# Patient Record
Sex: Male | Born: 1985 | Hispanic: Yes | Marital: Single | Smoking: Never smoker
Health system: Southern US, Community
[De-identification: ages and names within clinical notes are randomized; demographics above are authoritative.]

---

## 2011-06-06 ENCOUNTER — Ambulatory Visit: Payer: Self-pay | Admitting: Unknown Physician Specialty

## 2015-11-28 ENCOUNTER — Emergency Department
Admission: EM | Admit: 2015-11-28 | Discharge: 2015-11-28 | Disposition: A | Discharge status: Home / Self Care | Payer: BLUE CROSS/BLUE SHIELD | Attending: Emergency Medicine | Admitting: Emergency Medicine

## 2015-11-28 ENCOUNTER — Emergency Department: Payer: BLUE CROSS/BLUE SHIELD

## 2015-11-28 DIAGNOSIS — N5089 Other specified disorders of the male genital organs: Secondary | ICD-10-CM

## 2015-11-28 DIAGNOSIS — N50811 Right testicular pain: Secondary | ICD-10-CM

## 2015-11-28 MED ORDER — OXYCODONE-ACETAMINOPHEN 5-325 MG PO TABS
2.0000 | ORAL_TABLET | Freq: Once | ORAL | Status: AC
  Administered 2015-11-28: 2 via ORAL
  Filled 2015-11-28: qty 2

## 2015-11-28 MED ORDER — IBUPROFEN 800 MG PO TABS: 800.0000 mg | ORAL_TABLET | Freq: Three times a day (TID) | ORAL | 0 refills | Status: AC | PRN

## 2015-11-28 MED ORDER — TRAMADOL HCL 50 MG PO TABS: 50.0000 mg | ORAL_TABLET | Freq: Four times a day (QID) | ORAL | 0 refills | Status: AC | PRN

## 2015-11-28 MED ORDER — DOXYCYCLINE HYCLATE 100 MG PO CAPS: 100.0000 mg | ORAL_CAPSULE | Freq: Two times a day (BID) | ORAL | 0 refills | Status: AC

## 2015-11-28 NOTE — ED Notes (Signed)
Pt states he has had consistent pain for the past week. Right testicle is primary concern.  States pain is similar, but worse than an episode he had approx a year ago. Denies GU symptoms.

## 2015-11-28 NOTE — ED Provider Notes (Signed)
[facility] Emergency Department Provider Note        Time seen: ----------------------------------------- 11:34 AM on 11/28/2015 -----------------------------------------    I have reviewed the triage vital signs and the nursing notes.   HISTORY  Chief Complaint Testicle Pain    HPI Zachary Jordan is a 30 y.o. male who presents the ER with right-sided testicular pain for the last week. Patient reports his been red and swollen during the same period time. He reports severe tenderness but has not had any trouble with normal activity, states she is sexually active but uses condoms, does not have any dysuria. Has never had these symptoms before.   History reviewed. No pertinent past medical history.  There are no active problems to display for this patient.   History reviewed. No pertinent surgical history.  Allergies Penicillins  Social History Social History  Substance Use Topics  . Smoking status: Never Smoker  . Smokeless tobacco: Never Used  . Alcohol use Yes     Comment: occassionally    Review of Systems Constitutional: Negative for fever. Gastrointestinal: Negative for abdominal pain, vomiting and diarrhea. Genitourinary: Negative for dysuria.Positive for right-sided testicular pain Skin: Negative for rash. Neurological: Negative for headaches, focal weakness or numbness.  10-point ROS otherwise negative.  ____________________________________________   PHYSICAL EXAM:  VITAL SIGNS: ED Triage Vitals [11/28/15 1051]  Enc Vitals Group     BP (!) 134/91     Pulse Rate 69     Resp 16     Temp 98.1 F (36.7 C)     Temp Source Oral     SpO2 100 %     Weight 170 lb (77.1 kg)     Height 5\' 6"  (1.676 m)     Head Circumference      Peak Flow      Pain Score 7     Pain Loc      Pain Edu?      Excl. in [place]?     Constitutional: Alert and oriented. Well appearing and in no distress. Eyes: Conjunctivae are normal.  Normal  extraocular movements. Gastrointestinal: Soft and nontender. Normal bowel sounds Genitourinary: No specific testicular swelling or tenderness, scrotum appears normal Musculoskeletal: Nontender with normal range of motion in all extremities. No lower extremity tenderness nor edema. Neurologic:  Normal speech and language. No gross focal neurologic deficits are appreciated.  Skin:  Skin is warm, dry and intact. No rash noted. Psychiatric: Mood and affect are normal. Speech and behavior are normal.  ___________________________________________  ED COURSE:  Pertinent labs & imaging results that were available during my care of the patient were reviewed by me and considered in my medical decision making (see chart for details). Clinical Course  Patient is no acute distress, we will assess with ultrasound imaging.  Procedures ____________________________________________   RADIOLOGY  Scrotal ultrasound IMPRESSION: Unremarkable scrotal ultrasound.  No evidence of torsion.   ____________________________________________  FINAL ASSESSMENT AND PLAN  Testicular pain  Plan: Patient with labs and imaging as dictated above. Unclear etiology for his testicular pain, he'll be referred to urology for follow-up. We'll place on doxycycline. He is stable for discharge this time.   Emily FilbertWilliams, Lafe Clerk E, MD   Note: This dictation was prepared with Dragon dictation. Any transcriptional errors that result from this process are unintentional    Emily FilbertJonathan E Terran Klinke, MD 11/28/15 [phone]

## 2015-11-28 NOTE — ED Triage Notes (Signed)
Patient presents to the ED with right testicular pain, swelling, and redness x 1 week.  Patient reports severe tenderness to area.

## 2017-07-15 IMAGING — US US ART/VEN ABD/PELV/SCROTUM DOPPLER COMPLETE
1 series · 14 of 25 positions shown · non-contrast
Comparison: None.

CLINICAL DATA: Right testicular pain and swelling.

EXAM:
SCROTAL ULTRASOUND
DOPPLER ULTRASOUND OF THE TESTICLES
TECHNIQUE: Complete ultrasound examination of the testicles, epididymis, and
other scrotal structures was performed. Color and spectral Doppler
ultrasound were also utilized to evaluate blood flow to the
testicles.

[Series 1: us art/ven abd/pelv/scrotum doppler complete · 0.08mm/px · 14 of 48 slices shown]
[im 1/48]
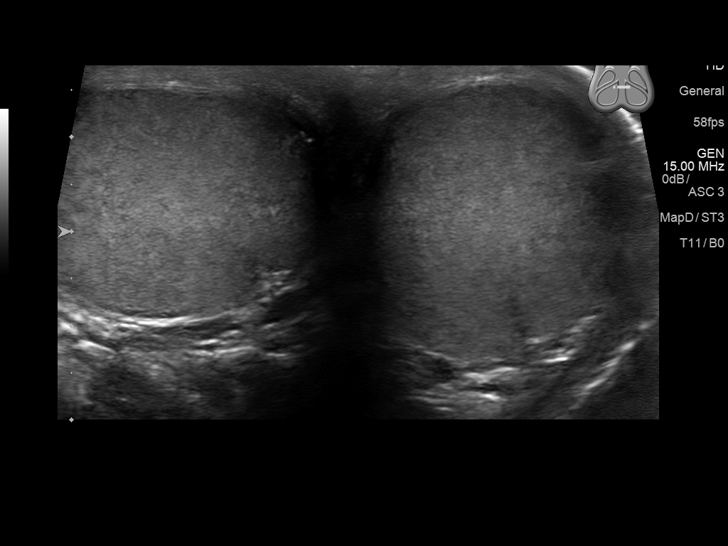
[im 4/48]
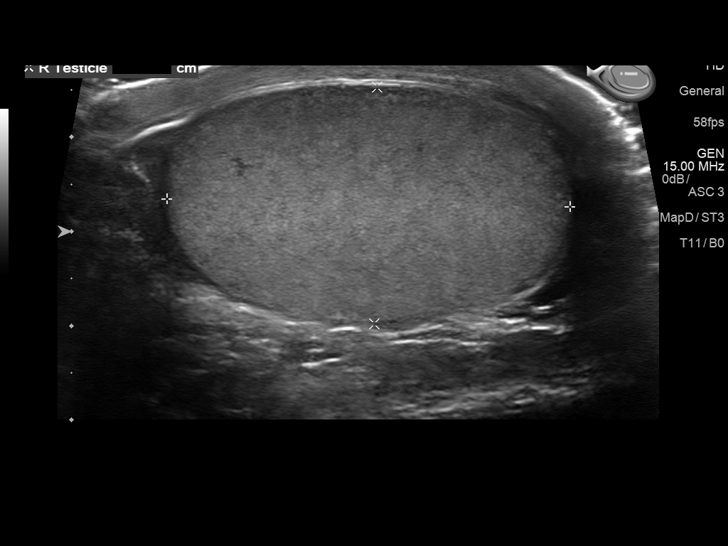
[im 8/48]
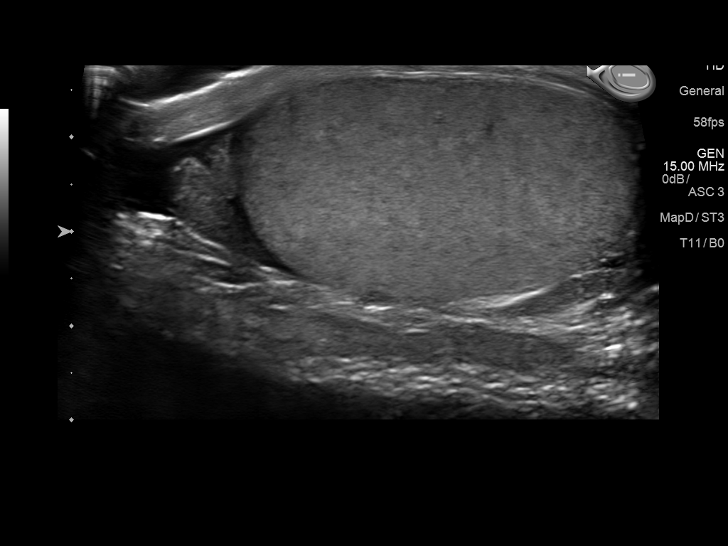
[im 12/48]
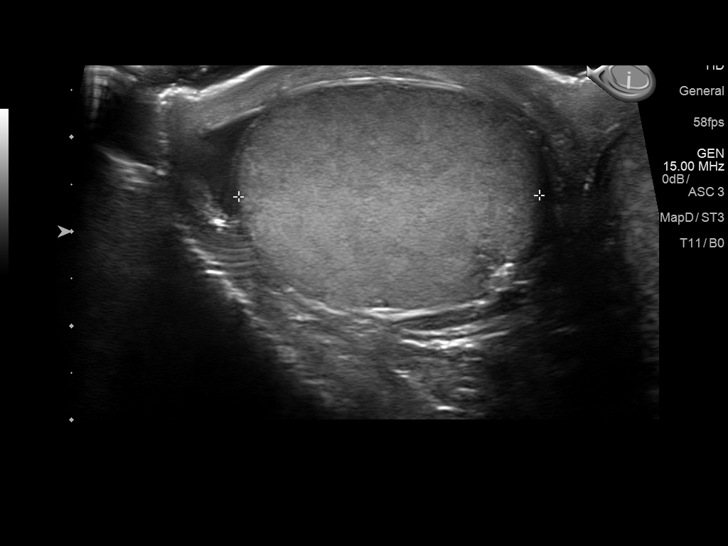
[im 16/48]
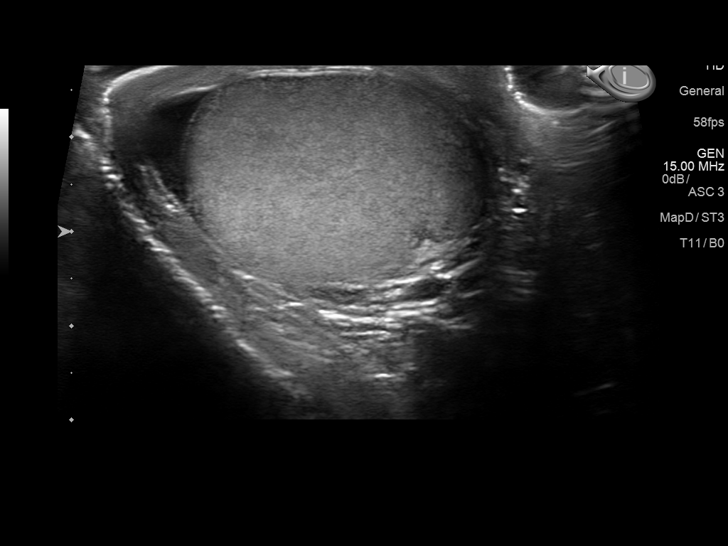
[im 18/48]
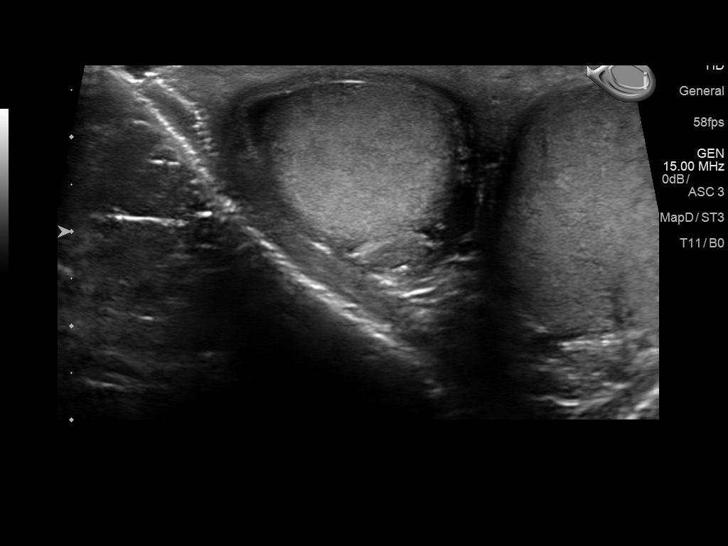
[im 22/48]
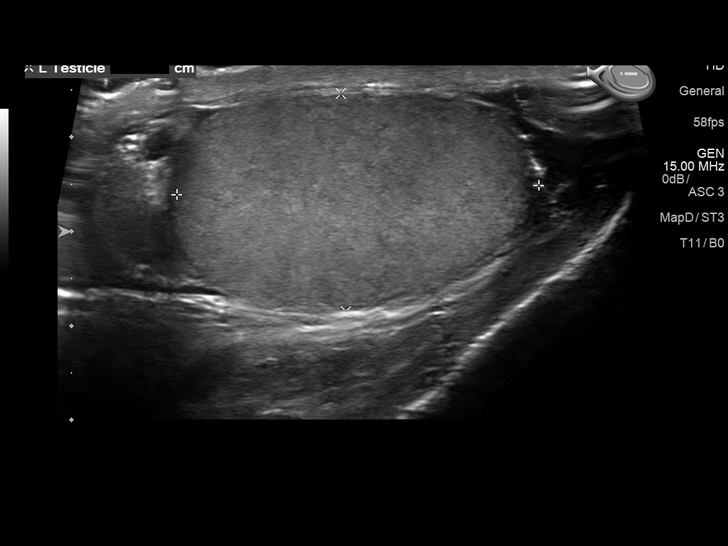
[im 26/48]
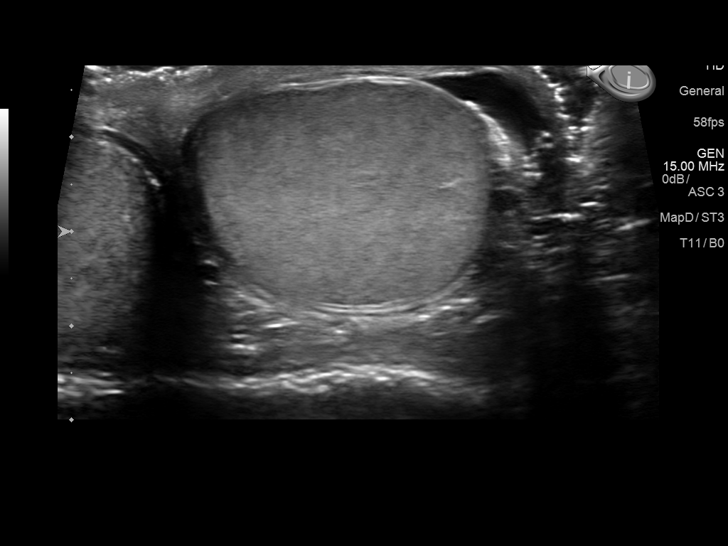
[im 30/48]
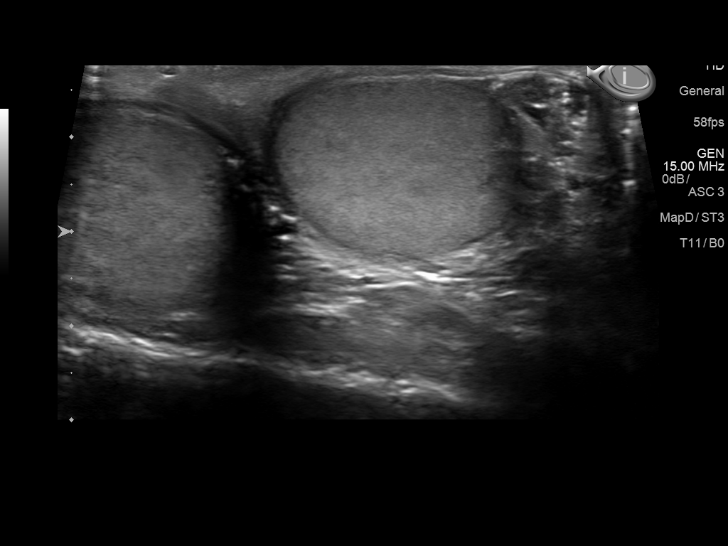
[im 32/48]
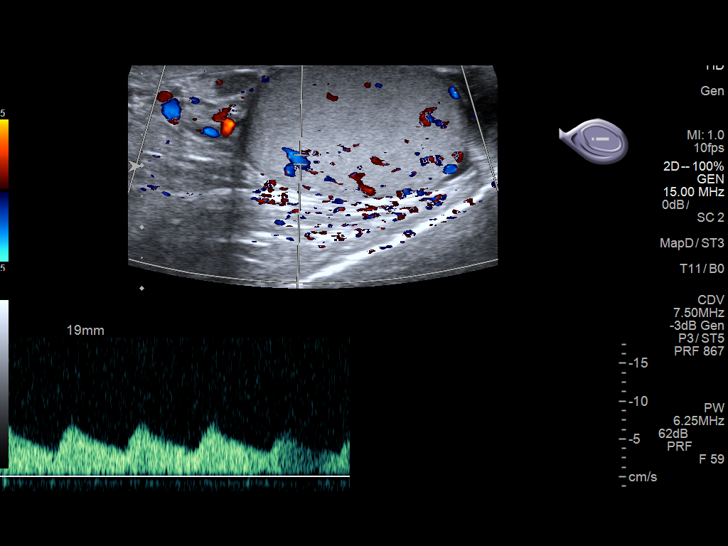
[im 36/48]
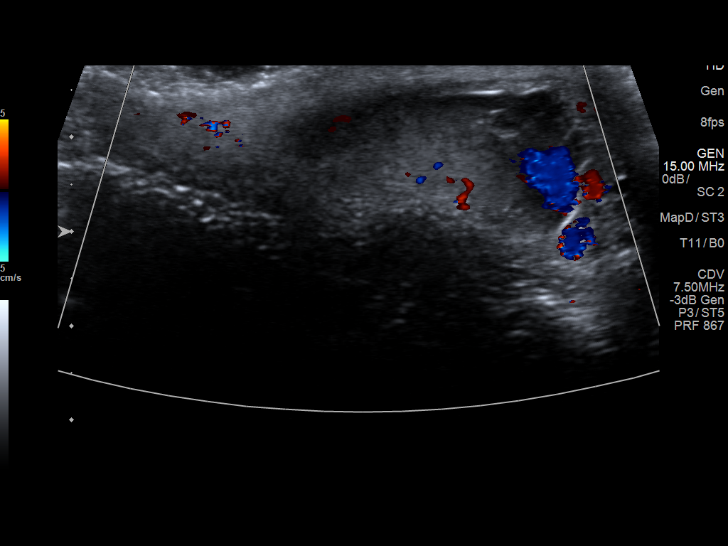
[im 40/48]
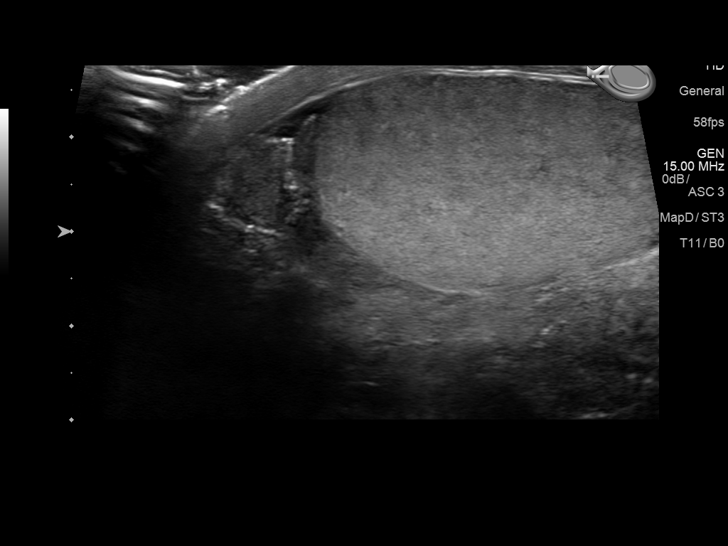
[im 44/48]
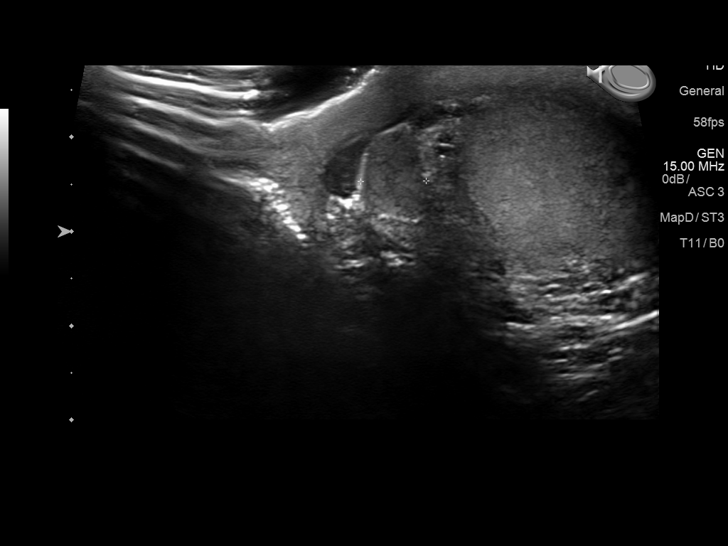
[im 48/48]
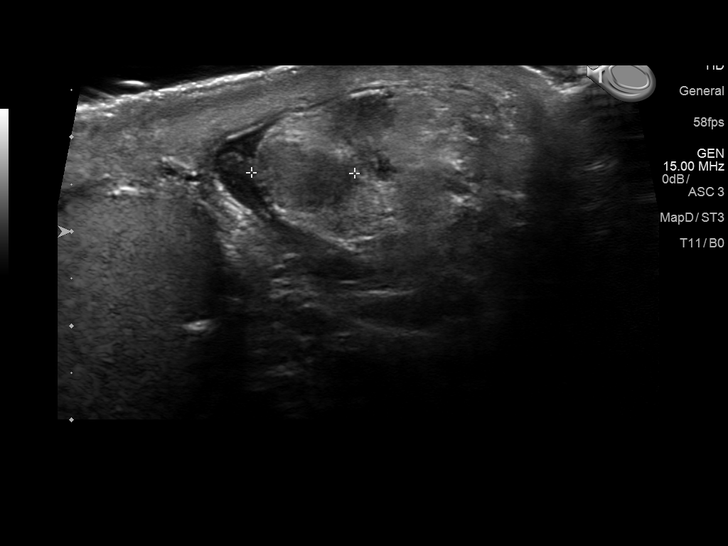

[14 of 25 positions shown; findings below may reference images not displayed]

FINDINGS: Right testicle

Measurements: 4.3 x 2.5 x 3.2 cm. No mass or microlithiasis
visualized.

Left testicle

Measurements: 3.8 x 2.3 x 3.0 cm. No mass or microlithiasis
visualized.

Right epididymis:  Normal in size and appearance.

Left epididymis:  Normal in size and appearance.

Hydrocele:  None visualized.

Varicocele:  None visualized.

Pulsed Doppler interrogation of both testes demonstrates normal low
resistance arterial and venous waveforms bilaterally.
IMPRESSION: Unremarkable scrotal ultrasound.  No evidence of torsion.

## 2018-10-04 ENCOUNTER — Other Ambulatory Visit: Payer: Self-pay

## 2018-10-04 ENCOUNTER — Emergency Department
Admission: EM | Admit: 2018-10-04 | Discharge: 2018-10-04 | Disposition: A | Discharge status: Home / Self Care | Payer: BLUE CROSS/BLUE SHIELD | Attending: Emergency Medicine | Admitting: Emergency Medicine

## 2018-10-04 ENCOUNTER — Encounter: Payer: Self-pay | Admitting: Emergency Medicine

## 2018-10-04 DIAGNOSIS — R52 Pain, unspecified: Secondary | ICD-10-CM | POA: Insufficient documentation

## 2018-10-04 DIAGNOSIS — M7918 Myalgia, other site: Secondary | ICD-10-CM

## 2018-10-04 MED ORDER — LIDOCAINE 5 % EX PTCH
1.0000 | MEDICATED_PATCH | CUTANEOUS | Status: DC
  Administered 2018-10-04: 21:00:00 1 via TRANSDERMAL
  Filled 2018-10-04: qty 1
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
## 2019-06-01 ENCOUNTER — Ambulatory Visit: Payer: Self-pay | Attending: Internal Medicine

## 2019-06-01 DIAGNOSIS — Z23 Encounter for immunization: Secondary | ICD-10-CM | POA: Insufficient documentation

## 2019-06-01 NOTE — Progress Notes (Signed)
   Covid-19 Vaccination Clinic  Name:  Commodore Bellew    MRN: [id number] DOB: 02-09-1986  06/01/2019  Mr. Printice Hellmer was observed post Covid-19 immunization for 15 minutes without incidence. He was provided with Vaccine Information Sheet and instruction to access the V-Safe system.   Mr. Isaiah Cianci was instructed to call 911 with any severe reactions post vaccine: Marland Kitchen Difficulty breathing  . Swelling of your face and throat  . A fast heartbeat  . A bad rash all over your body  . Dizziness and weakness    Immunizations Administered    Name Date Dose VIS Date Route   Moderna COVID-19 Vaccine 06/01/2019  5:17 PM 0.5 mL 03/10/2019 Intramuscular   Manufacturer: Moderna   Lot: [id number]   NDC: 67893-810-17

## 2019-06-30 ENCOUNTER — Other Ambulatory Visit: Payer: Self-pay

## 2019-06-30 ENCOUNTER — Ambulatory Visit: Payer: No Typology Code available for payment source | Attending: Internal Medicine

## 2019-06-30 DIAGNOSIS — Z23 Encounter for immunization: Secondary | ICD-10-CM

## 2019-06-30 NOTE — Progress Notes (Signed)
   Covid-19 Vaccination Clinic  Name:  Zachary Jordan    MRN: [id number] DOB: 17-Oct-1985  06/30/2019  Mr. Zachary Jordan was observed post Covid-19 immunization for 15 minutes without incident. He was provided with Vaccine Information Sheet and instruction to access the V-Safe system.   Mr. Zachary Jordan was instructed to call 911 with any severe reactions post vaccine: Marland Kitchen Difficulty breathing  . Swelling of face and throat  . A fast heartbeat  . A bad rash all over body  . Dizziness and weakness   Immunizations Administered    Name Date Dose VIS Date Route   Moderna COVID-19 Vaccine 06/30/2019 11:29 AM 0.5 mL 03/10/2019 Intramuscular   Manufacturer: Gala Murdoch   Lot: [id number]   NDC: 85462-703-50

## 2020-05-16 ENCOUNTER — Other Ambulatory Visit
Admission: RE | Admit: 2020-05-16 | Discharge: 2020-05-16 | Disposition: A | Discharge status: Home / Self Care | Payer: Self-pay | Source: Ambulatory Visit | Attending: Internal Medicine | Admitting: Internal Medicine

## 2020-05-16 DIAGNOSIS — R079 Chest pain, unspecified: Secondary | ICD-10-CM | POA: Insufficient documentation

## 2020-05-16 LAB — TROPONIN I (HIGH SENSITIVITY): Troponin I (High Sensitivity): <2 ng/L (ref <18)

## 2020-07-21 ENCOUNTER — Other Ambulatory Visit
Admission: RE | Admit: 2020-07-21 | Discharge: 2020-07-21 | Disposition: A | Discharge status: Home / Self Care | Payer: 59 | Source: Ambulatory Visit | Attending: Student | Admitting: Student

## 2020-07-21 DIAGNOSIS — R079 Chest pain, unspecified: Secondary | ICD-10-CM | POA: Insufficient documentation

## 2020-07-21 LAB — D-DIMER, QUANTITATIVE: D-Dimer, Quant: <0.27 ug/mL-FEU (ref 0.00–0.50)

## 2022-03-26 ENCOUNTER — Ambulatory Visit
Admission: RE | Admit: 2022-03-26 | Discharge: 2022-03-26 | Disposition: A | Discharge status: Home / Self Care | Payer: Self-pay | Attending: Family | Admitting: Family

## 2022-03-26 ENCOUNTER — Ambulatory Visit
Admission: RE | Admit: 2022-03-26 | Discharge: 2022-03-26 | Disposition: A | Discharge status: Home / Self Care | Payer: Self-pay | Source: Ambulatory Visit | Attending: Family | Admitting: Family

## 2022-03-26 ENCOUNTER — Other Ambulatory Visit: Payer: Self-pay | Admitting: Family

## 2022-03-26 DIAGNOSIS — M79645 Pain in left finger(s): Secondary | ICD-10-CM | POA: Insufficient documentation

## 2022-03-26 DIAGNOSIS — M7989 Other specified soft tissue disorders: Secondary | ICD-10-CM

## 2022-03-26 DIAGNOSIS — M79644 Pain in right finger(s): Secondary | ICD-10-CM | POA: Diagnosis present
